# Patient Record
Sex: Female | Born: 1995 | Race: White | Hispanic: No | Marital: Single | Smoking: Never smoker
Health system: Southern US, Community
[De-identification: ages and names within clinical notes are randomized; demographics above are authoritative.]

## PROBLEM LIST (undated history)

## (undated) HISTORY — PX: ANTERIOR CRUCIATE LIGAMENT REPAIR: SHX115

---

## 2017-06-16 ENCOUNTER — Emergency Department (HOSPITAL_BASED_OUTPATIENT_CLINIC_OR_DEPARTMENT_OTHER)
Admission: EM | Admit: 2017-06-16 | Discharge: 2017-06-16 | Disposition: A | Discharge status: Home / Self Care | Payer: Managed Care, Other (non HMO) | Attending: Emergency Medicine | Admitting: Emergency Medicine

## 2017-06-16 ENCOUNTER — Emergency Department (HOSPITAL_BASED_OUTPATIENT_CLINIC_OR_DEPARTMENT_OTHER): Payer: Managed Care, Other (non HMO)

## 2017-06-16 ENCOUNTER — Encounter (HOSPITAL_BASED_OUTPATIENT_CLINIC_OR_DEPARTMENT_OTHER): Payer: Self-pay | Admitting: Emergency Medicine

## 2017-06-16 DIAGNOSIS — R112 Nausea with vomiting, unspecified: Secondary | ICD-10-CM | POA: Diagnosis not present

## 2017-06-16 DIAGNOSIS — R1030 Lower abdominal pain, unspecified: Secondary | ICD-10-CM | POA: Insufficient documentation

## 2017-06-16 LAB — CBC WITH DIFFERENTIAL/PLATELET
Basophils Absolute: 0 10*3/uL (ref 0.0–0.1)
Basophils Relative: 1 %
EOS ABS: 0.1 10*3/uL (ref 0.0–0.7)
EOS PCT: 1 %
HCT: 37.4 % (ref 36.0–46.0)
Hemoglobin: 13.1 g/dL (ref 12.0–15.0)
LYMPHS ABS: 1.7 10*3/uL (ref 0.7–4.0)
Lymphocytes Relative: 27 %
MCH: 30.7 pg (ref 26.0–34.0)
MCHC: 35 g/dL (ref 30.0–36.0)
MCV: 87.6 fL (ref 78.0–100.0)
MONO ABS: 0.6 10*3/uL (ref 0.1–1.0)
MONOS PCT: 9 %
Neutro Abs: 3.9 10*3/uL (ref 1.7–7.7)
Neutrophils Relative %: 62 %
PLATELETS: 264 10*3/uL (ref 150–400)
RBC: 4.27 MIL/uL (ref 3.87–5.11)
RDW: 12.2 % (ref 11.5–15.5)
WBC: 6.3 10*3/uL (ref 4.0–10.5)

## 2017-06-16 LAB — URINALYSIS, ROUTINE W REFLEX MICROSCOPIC
BILIRUBIN URINE: NEGATIVE
GLUCOSE, UA: NEGATIVE mg/dL
HGB URINE DIPSTICK: NEGATIVE
KETONES UR: NEGATIVE mg/dL
Leukocytes, UA: NEGATIVE
NITRITE: NEGATIVE
PH: 6.5 (ref 5.0–8.0)
Protein, ur: NEGATIVE mg/dL
Specific Gravity, Urine: 1.02 (ref 1.005–1.030)

## 2017-06-16 LAB — BASIC METABOLIC PANEL
Anion gap: 8 (ref 5–15)
BUN: 9 mg/dL (ref 6–20)
CHLORIDE: 106 mmol/L (ref 101–111)
CO2: 25 mmol/L (ref 22–32)
CREATININE: 0.82 mg/dL (ref 0.44–1.00)
Calcium: 9.2 mg/dL (ref 8.9–10.3)
GFR calc Af Amer: >60 mL/min (ref >60)
GLUCOSE: 100 mg/dL — AB (ref 65–99)
Potassium: 3.9 mmol/L (ref 3.5–5.1)
SODIUM: 139 mmol/L (ref 135–145)

## 2017-06-16 LAB — PREGNANCY, URINE: Preg Test, Ur: NEGATIVE

## 2017-06-16 MED ORDER — KETOROLAC TROMETHAMINE 30 MG/ML IJ SOLN
15.0000 mg | Freq: Once | INTRAMUSCULAR | Status: AC
  Administered 2017-06-16: 15 mg via INTRAVENOUS
  Filled 2017-06-16: qty 1

## 2017-06-16 MED ORDER — SODIUM CHLORIDE 0.9 % IV BOLUS (SEPSIS)
1000.0000 mL | Freq: Once | INTRAVENOUS | Status: AC
  Administered 2017-06-16: 1000 mL via INTRAVENOUS

## 2017-06-16 MED ORDER — ONDANSETRON HCL 4 MG/2ML IJ SOLN
4.0000 mg | Freq: Once | INTRAMUSCULAR | Status: AC
  Administered 2017-06-16: 4 mg via INTRAVENOUS
  Filled 2017-06-16: qty 2

## 2017-06-16 NOTE — ED Notes (Signed)
Spoke with pt's mother on the phone per her request. Micah Flesher over pt's lab results and discussed having CT scan done. Mother is satisfied with not having CT done at this time due to all lab work being normal and pt's appears to be feeling better. Pt has not had any vomiting since arrival to ED. Dr. Nicanor Alcon informed of conversation. Will PO challenge pt at this time.

## 2017-06-16 NOTE — ED Provider Notes (Signed)
[facility]   CSN: [id number] Arrival date & time: 06/16/17  0402     History   Chief Complaint Chief Complaint  Patient presents with  . Abdominal Pain    HPI Melanie Herrera is a 21 y.o. female.  The history is provided by the patient.  Abdominal Pain   This is a new problem. The current episode started 3 to 5 hours ago. The problem occurs constantly. The pain is associated with an unknown factor. The quality of the pain is cramping. The pain is moderate. Associated symptoms include nausea and vomiting. Pertinent negatives include fever, hematochezia, dysuria and myalgias. Nothing aggravates the symptoms. Nothing relieves the symptoms. Past workup does not include GI consult. Her past medical history does not include PUD, ulcerative colitis, Crohn's disease or irritable bowel syndrome.    History reviewed. No pertinent past medical history.  There are no active problems to display for this patient.   Past Surgical History:  Procedure Laterality Date  . ANTERIOR CRUCIATE LIGAMENT REPAIR      OB History    No data available       Home Medications    Prior to Admission medications   Not on File    Family History No family history on file.  Social History Social History  Substance Use Topics  . Smoking status: Never Smoker  . Smokeless tobacco: Never Used  . Alcohol use Yes     Allergies   Patient has no known allergies.   Review of Systems Review of Systems  Constitutional: Negative for appetite change and fever.  Gastrointestinal: Positive for abdominal pain, nausea and vomiting. Negative for hematochezia.  Genitourinary: Negative for dysuria.  Musculoskeletal: Negative for myalgias.  All other systems reviewed and are negative.    Physical Exam Updated Vital Signs BP 119/88 (BP Location: Right Arm)   Pulse 82   Temp 98.2 F (36.8 C) (Oral)   Resp 16   Ht  (1.6 m)   Wt 52.2 kg (115 lb)   LMP 06/02/2017  (Approximate)   SpO2 100%   BMI 20.37 kg/m   Physical Exam  Constitutional: She is oriented to person, place, and time. She appears well-developed and well-nourished. No distress.  HENT:  Head: Normocephalic and atraumatic.  Mouth/Throat: No oropharyngeal exudate.  Eyes: Pupils are equal, round, and reactive to light. Conjunctivae are normal.  Neck: Normal range of motion. Neck supple.  Cardiovascular: Normal rate, regular rhythm, normal heart sounds and intact distal pulses.   Pulmonary/Chest: Effort normal and breath sounds normal. She has no wheezes. She has no rales.  Abdominal: Soft. Bowel sounds are normal. She exhibits no mass. There is no tenderness. There is no rebound, no guarding, no tenderness at McBurney's point and negative Murphy's sign.  Abdomen is soft, no guarding or rebound, negative psoas, able to hop on one foot without pain.    Musculoskeletal: Normal range of motion.  Lymphadenopathy:    She has no cervical adenopathy.  Neurological: She is alert and oriented to person, place, and time.  Skin: Skin is warm and dry. Capillary refill takes less than 2 seconds.  Psychiatric: She has a normal mood and affect.     ED Treatments / Results  Labs (all labs ordered are listed, but only abnormal results are displayed)  Results for orders placed or performed during the hospital encounter of 06/16/17  Urinalysis, Routine w reflex microscopic  Result Value Ref Range   Color, Urine YELLOW YELLOW  APPearance CLEAR CLEAR   Specific Gravity, Urine 1.020 1.005 - 1.030   pH 6.5 5.0 - 8.0   Glucose, UA NEGATIVE NEGATIVE mg/dL   Hgb urine dipstick NEGATIVE NEGATIVE   Bilirubin Urine NEGATIVE NEGATIVE   Ketones, ur NEGATIVE NEGATIVE mg/dL   Protein, ur NEGATIVE NEGATIVE mg/dL   Nitrite NEGATIVE NEGATIVE   Leukocytes, UA NEGATIVE NEGATIVE  Pregnancy, urine  Result Value Ref Range   Preg Test, Ur NEGATIVE NEGATIVE  CBC with Differential/Platelet  Result Value Ref Range     WBC 6.3 4.0 - 10.5 K/uL   RBC 4.27 3.87 - 5.11 MIL/uL   Hemoglobin 13.1 12.0 - 15.0 g/dL   HCT 16.1 09.6 - 04.5 %   MCV 87.6 78.0 - 100.0 fL   MCH 30.7 26.0 - 34.0 pg   MCHC 35.0 30.0 - 36.0 g/dL   RDW 40.9 81.1 - 91.4 %   Platelets 264 150 - 400 K/uL   Neutrophils Relative % 62 %   Neutro Abs 3.9 1.7 - 7.7 K/uL   Lymphocytes Relative 27 %   Lymphs Abs 1.7 0.7 - 4.0 K/uL   Monocytes Relative 9 %   Monocytes Absolute 0.6 0.1 - 1.0 K/uL   Eosinophils Relative 1 %   Eosinophils Absolute 0.1 0.0 - 0.7 K/uL   Basophils Relative 1 %   Basophils Absolute 0.0 0.0 - 0.1 K/uL  Basic metabolic panel  Result Value Ref Range   Sodium 139 135 - 145 mmol/L   Potassium 3.9 3.5 - 5.1 mmol/L   Chloride 106 101 - 111 mmol/L   CO2 25 22 - 32 mmol/L   Glucose, Bld 100 (H) 65 - 99 mg/dL   BUN 9 6 - 20 mg/dL   Creatinine, Ser 7.82 0.44 - 1.00 mg/dL   Calcium 9.2 8.9 - 95.6 mg/dL   GFR calc non Af Amer >60 >60 mL/min   GFR calc Af Amer >60 >60 mL/min   Anion gap 8 5 - 15   No results found.  Radiology No results found.  Procedures Procedures (including critical care time)  Medications Ordered in ED Medications  sodium chloride 0.9 % bolus 1,000 mL (1,000 mLs Intravenous New Bag/Given 06/16/17 0431)  ondansetron (ZOFRAN) injection 4 mg (4 mg Intravenous Given 06/16/17 0431)  ketorolac (TORADOL) 30 MG/ML injection 15 mg (15 mg Intravenous Given 06/16/17 0456)    Patient discussed her care with her mother and they have elected to wait on CT scan.  She feels much better  Final Clinical Impressions(s) / ED Diagnoses   Without fever, anorexia, RLQ tenderness on exam it is reasonable to wait on CT scan.  She is given strict return precautions for constant pain, persistent nausea or vomiting, fever, anorexia if the abdomen gets rock hard, she's unable to tolerate food or any concerns.  Patient verbalizes understanding and agrees to follow up.    Shortness of breath, swelling or the lips or  tongue, chest pain, dyspnea on exertion, new weakness or numbness changes in vision or speech,  Inability to tolerate liquids or food, changes in voice cough, altered mental status or any concerns. No signs of systemic illness or infection. The patient is nontoxic-appearing on exam and vital signs are within normal limits.    I have reviewed the triage vital signs and the nursing notes. Pertinent labs &imaging results that were available during my care of the patient were reviewed by me and considered in my medical decision making (see chart for details).  After  history, exam, and medical workup I feel the patient has been appropriately medically screened and is safe for discharge home. Pertinent diagnoses were discussed with the patient. Patient was given return precautions.        Volland, Mckynzie Liwanag, MD 06/16/17 [phone]

## 2017-06-16 NOTE — ED Triage Notes (Signed)
Pt reports right sided abd pain that started at 1 am with vomiting x 5 episodes since onset. Pt denies any diarrhea.

## 2020-06-17 ENCOUNTER — Emergency Department
Admission: EM | Admit: 2020-06-17 | Discharge: 2020-06-17 | Disposition: A | Discharge status: Home / Self Care | Payer: 59 | Attending: Emergency Medicine | Admitting: Emergency Medicine

## 2020-06-17 ENCOUNTER — Emergency Department: Payer: 59

## 2020-06-17 ENCOUNTER — Other Ambulatory Visit
Admission: RE | Admit: 2020-06-17 | Discharge: 2020-06-17 | Disposition: A | Discharge status: Home / Self Care | Payer: 59 | Source: Ambulatory Visit | Attending: Family Medicine | Admitting: Family Medicine

## 2020-06-17 ENCOUNTER — Other Ambulatory Visit: Payer: Self-pay

## 2020-06-17 ENCOUNTER — Encounter: Payer: Self-pay | Admitting: Emergency Medicine

## 2020-06-17 DIAGNOSIS — R0781 Pleurodynia: Secondary | ICD-10-CM | POA: Diagnosis present

## 2020-06-17 DIAGNOSIS — R000 Tachycardia, unspecified: Secondary | ICD-10-CM | POA: Diagnosis not present

## 2020-06-17 DIAGNOSIS — J189 Pneumonia, unspecified organism: Secondary | ICD-10-CM

## 2020-06-17 DIAGNOSIS — J181 Lobar pneumonia, unspecified organism: Secondary | ICD-10-CM | POA: Insufficient documentation

## 2020-06-17 DIAGNOSIS — Z20822 Contact with and (suspected) exposure to covid-19: Secondary | ICD-10-CM | POA: Insufficient documentation

## 2020-06-17 DIAGNOSIS — M546 Pain in thoracic spine: Secondary | ICD-10-CM | POA: Diagnosis not present

## 2020-06-17 DIAGNOSIS — R0600 Dyspnea, unspecified: Secondary | ICD-10-CM | POA: Insufficient documentation

## 2020-06-17 LAB — COMPREHENSIVE METABOLIC PANEL
ALT: 20 U/L (ref 0–44)
AST: 23 U/L (ref 15–41)
Albumin: 4.1 g/dL (ref 3.5–5.0)
Alkaline Phosphatase: 56 U/L (ref 38–126)
Anion gap: 9 (ref 5–15)
BUN: 11 mg/dL (ref 6–20)
CO2: 25 mmol/L (ref 22–32)
Calcium: 9.2 mg/dL (ref 8.9–10.3)
Chloride: 101 mmol/L (ref 98–111)
Creatinine, Ser: 0.7 mg/dL (ref 0.44–1.00)
GFR calc Af Amer: >60 mL/min (ref >60)
GFR calc non Af Amer: >60 mL/min (ref >60)
Glucose, Bld: 89 mg/dL (ref 70–99)
Potassium: 4.2 mmol/L (ref 3.5–5.1)
Sodium: 135 mmol/L (ref 135–145)
Total Bilirubin: 0.9 mg/dL (ref 0.3–1.2)
Total Protein: 8.1 g/dL (ref 6.5–8.1)

## 2020-06-17 LAB — CBC WITH DIFFERENTIAL/PLATELET
Abs Immature Granulocytes: 0.03 10*3/uL (ref 0.00–0.07)
Basophils Absolute: 0 10*3/uL (ref 0.0–0.1)
Basophils Relative: 0 %
Eosinophils Absolute: 0 10*3/uL (ref 0.0–0.5)
Eosinophils Relative: 0 %
HCT: 35.6 % — ABNORMAL LOW (ref 36.0–46.0)
Hemoglobin: 12.3 g/dL (ref 12.0–15.0)
Immature Granulocytes: 0 %
Lymphocytes Relative: 12 %
Lymphs Abs: 1 10*3/uL (ref 0.7–4.0)
MCH: 30.9 pg (ref 26.0–34.0)
MCHC: 34.6 g/dL (ref 30.0–36.0)
MCV: 89.4 fL (ref 80.0–100.0)
Monocytes Absolute: 0.5 10*3/uL (ref 0.1–1.0)
Monocytes Relative: 6 %
Neutro Abs: 7.3 10*3/uL (ref 1.7–7.7)
Neutrophils Relative %: 82 %
Platelets: 308 10*3/uL (ref 150–400)
RBC: 3.98 MIL/uL (ref 3.87–5.11)
RDW: 12.3 % (ref 11.5–15.5)
WBC: 8.9 10*3/uL (ref 4.0–10.5)
nRBC: 0 % (ref 0.0–0.2)

## 2020-06-17 LAB — RESPIRATORY PANEL BY RT PCR (FLU A&B, COVID)
Influenza A by PCR: NEGATIVE
Influenza B by PCR: NEGATIVE
SARS Coronavirus 2 by RT PCR: NEGATIVE

## 2020-06-17 LAB — FIBRIN DERIVATIVES D-DIMER (ARMC ONLY)
Fibrin derivatives D-dimer (ARMC): 504.38 ng/mL (FEU) — ABNORMAL HIGH (ref 0.00–499.00)
Fibrin derivatives D-dimer (ARMC): 520.34 ng/mL (FEU) — ABNORMAL HIGH (ref 0.00–499.00)

## 2020-06-17 MED ORDER — IOHEXOL 350 MG/ML SOLN
75.0000 mL | Freq: Once | INTRAVENOUS | Status: AC | PRN
  Administered 2020-06-17: 16:00:00 75 mL via INTRAVENOUS
  Filled 2020-06-17: qty 75

## 2020-06-17 MED ORDER — CEFDINIR 300 MG PO CAPS: 300.0000 mg | ORAL_CAPSULE | Freq: Two times a day (BID) | ORAL | 0 refills | Status: AC

## 2020-06-17 NOTE — Discharge Instructions (Addendum)
You were seen in the ED because of your chest pain.  We did a CT scan of your chest that shows evidence of a pneumonia sitting in the very backside of her lungs, which is likely why you have pain in your back with deep inspirations.  You are being discharged a prescription for Omnicef/cefdinir antibiotic.  Please take 1 pill, 2 times per day, for the next 10 days.  Please finish all 20 pills, even if your symptoms are getting better.  Please take Tylenol and ibuprofen/Advil for your pain.  It is safe to take them together, or to alternate them every few hours.  Take up to 1000mg  of Tylenol at a time, up to 4 times per day.  Do not take more than 4000 mg of Tylenol in 24 hours.  For ibuprofen, take 400-600 mg, 4-5 times per day.  I would also recommend that you follow-up with your primary care physician within the next 1 week to ensure that your symptoms are improving.  If you develop any worsening symptoms despite these medications, please return to the ED.

## 2020-06-17 NOTE — ED Provider Notes (Signed)
[facility] Emergency Department Provider Note ____________________________________________   First MD Initiated Contact with Patient 06/17/20 1340     (approximate)  I have reviewed the triage vital signs and the nursing notes.  HISTORY  Chief Complaint No chief complaint on file.   HPI Melanie Herrera is a 24 y.o. femalewho presents to the ED for evaluation of chest pain.   Chart review indicates patient was seen earlier this morning at [facility] clinic walk-in clinic, noted to have pleuritic chest pain, tachycardia and a positive outpatient D-dimer.  Urine pregnancy test negative this morning.  Referred to the ED for CTA chest to evaluate for PE.  Patient reports about 3-4 days of pleuritic right-sided posterior chest pain.  She reports occasionally coughing, and this is typically nonproductive.  Her primary complaint is pain.  She denies any pain at rest, but reports pleuritic pain, up to 7/10 intensity and located to her right posterior back thoracically.  This pain is nonradiating and she has not taken any medications to help alleviate this pain.  She denies any frontal chest pain, vomiting, syncope, abdominal pain, stool changes, dysuria, vaginal bleeding/discharge.  Denies syncope, trauma and fever.  She reports being fully vaccinated with childhood vaccines, COVID-19 vaccine and influenza.  Lives in a dorm and is a resident at [facility], has family and PCP in [place].  History reviewed. No pertinent past medical history.  There are no problems to display for this patient.   Past Surgical History:  Procedure Laterality Date  . ANTERIOR CRUCIATE LIGAMENT REPAIR      Prior to Admission medications   Medication Sig Start Date End Date Taking? Authorizing Provider  cefdinir (OMNICEF) 300 MG capsule Take 1 capsule (300 mg total) by mouth 2 (two) times daily for 10 days. 06/17/20 06/27/20  Delton Prairie, MD    Allergies Patient has  no known allergies.  No family history on file.  Social History Social History   Tobacco Use  . Smoking status: Never Smoker  . Smokeless tobacco: Never Used  Substance Use Topics  . Alcohol use: Yes  . Drug use: No    Review of Systems  Constitutional: No fever/chills Eyes: No visual changes. ENT: No sore throat. Cardiovascular: Positive for chest pain. Respiratory: Denies shortness of breath.  Positive for cough Gastrointestinal: No abdominal pain.  No nausea, no vomiting.  No diarrhea.  No constipation. Genitourinary: Negative for dysuria. Musculoskeletal: Negative for back pain. Skin: Negative for rash. Neurological: Negative for headaches, focal weakness or numbness.  ____________________________________________   PHYSICAL EXAM:  VITAL SIGNS: Vitals:   06/17/20 1053  BP: 128/79  Pulse: (!) 114  Resp: 20  Temp: 99.3 F (37.4 C)  SpO2: 100%      Constitutional: Alert and oriented. Well appearing and in no acute distress.  Pleasant and conversational full sentences. Eyes: Conjunctivae are normal. PERRL. EOMI. Head: Atraumatic. Nose: No congestion/rhinnorhea. Mouth/Throat: Mucous membranes are moist.  Oropharynx non-erythematous. Neck: No stridor. No cervical spine tenderness to palpation. Cardiovascular: Tachycardic rate, regular rhythm. Grossly normal heart sounds.  Good peripheral circulation.  Respiratory: Normal respiratory effort.  No retractions.  Faint right basilar crackles, lungs otherwise CTAB. Gastrointestinal: Soft , nondistended, nontender to palpation. No abdominal bruits. No CVA tenderness. Musculoskeletal: No lower extremity tenderness nor edema.  No joint effusions. No signs of acute trauma. Neurologic:  Normal speech and language. No gross focal neurologic deficits are appreciated. No gait instability noted. Skin:  Skin is warm, dry  and intact. No rash noted. Psychiatric: Mood and affect are normal. Speech and behavior are  normal.  ____________________________________________   LABS (all labs ordered are listed, but only abnormal results are displayed)  Labs Reviewed  CBC WITH DIFFERENTIAL/PLATELET - Abnormal; Notable for the following components:      Result Value   HCT 35.6 (*)    All other components within normal limits  FIBRIN DERIVATIVES D-DIMER ([facility] ONLY) - Abnormal; Notable for the following components:   Fibrin derivatives D-dimer ([facility]) 520.34 (*)    All other components within normal limits  RESPIRATORY PANEL BY RT PCR (FLU A&B, COVID)  COMPREHENSIVE METABOLIC PANEL  POC URINE PREG, ED   ____________________________________________  RADIOLOGY  ED MD interpretation: CT chest reviewed with right lower lobe rounded airspace opacity  Official radiology report(s): CT Angio Chest PE W and/or Wo Contrast  Result Date: 06/17/2020 CLINICAL DATA:  Shortness of breath and elevated D-dimer. EXAM: CT ANGIOGRAPHY CHEST WITH CONTRAST TECHNIQUE: Multidetector CT imaging of the chest was performed using the standard protocol during bolus administration of intravenous contrast. Multiplanar CT image reconstructions and MIPs were obtained to evaluate the vascular anatomy. CONTRAST:  36mL OMNIPAQUE IOHEXOL 350 MG/ML SOLN COMPARISON:  None. FINDINGS: Cardiovascular: The pulmonary arteries are adequately opacified. There is no evidence of pulmonary embolism. The thoracic aorta is of normal caliber and normally patent. The heart size is normal. No pericardial fluid identified. Mediastinum/Nodes: Mildly prominent right hilar and subcarinal lymph nodes are likely reactive. Lungs/Pleura: Rounded area of airspace consolidation in the superior segment of the right lower lobe in the posterior subpleural lung measures roughly 4.0 x 2.7 x 3.8 cm. This is most likely consistent with a round pneumonia in this age population. No associated pleural fluid, pneumothorax or pulmonary edema. Upper Abdomen: No acute abnormality.  Musculoskeletal: No chest wall abnormality. No acute or significant osseous findings. Review of the MIP images confirms the above findings. IMPRESSION: 1. No evidence of pulmonary embolism. 2. Rounded area of airspace consolidation in the superior segment of the right lower lobe in the posterior subpleural lung measures roughly 4.0 x 2.7 x 3.8 cm and is most likely consistent with a round pneumonia in this age population. Mildly prominent right hilar and subcarinal lymph nodes are likely reactive. Follow-up with chest x-ray is recommended to ensure resolution with treatment. Electronically Signed   By: Irish Lack M.D.   On: 06/17/2020 16:15    ____________________________________________   PROCEDURES and INTERVENTIONS  Procedure(s) performed (including Critical Care):  Procedures  Medications  iohexol (OMNIPAQUE) 350 MG/ML injection 75 mL (75 mLs Intravenous Contrast Given 06/17/20 1558)    ____________________________________________   MDM / ED COURSE  Otherwise healthy 24 year old female presents to the ED for evaluation of right-sided posterior pleuritic chest pain, most consistent with peripheral community-acquired pneumonia, and amenable to outpatient management.  Patient tachycardic upon arrival, otherwise normal vital signs on room air.  Exam is reassuring, demonstrating a well-appearing patient who is no distress, tachypnea/dyspnea, or symptoms to preclude outpatient management.  She does have faint right basilar crackles, otherwise a benign pulmonary exam.  Basic labs unremarkable.  Negative urine pregnancy at clinic this morning and positive D-dimer at clinic this morning.  CTA chest obtained to evaluate for acute PE due to her symptoms, D-dimer and sinus tachycardia.  This does not show evidence of acute PE, though does show evidence of a peripheral round CAP near her pleura and likely the source of her pain.  While her symptoms are not  completely typical, her work-up is rather  clear.  She is suitable for outpatient management and following up with her PCP.  We discussed return precautions for the ED and outpatient management.  Patient medically stable for discharge home.     ____________________________________________   FINAL CLINICAL IMPRESSION(S) / ED DIAGNOSES  Final diagnoses:  Community acquired pneumonia of right lower lobe of lung  Sinus tachycardia  Pleurodynia     ED Discharge Orders         Ordered    cefdinir (OMNICEF) 300 MG capsule  2 times daily        06/17/20 1656           Dmarius Reeder   Note:  This document was prepared using Conservation officer, historic buildings and may include unintentional dictation errors.   Delton Prairie, MD 06/17/20 [phone]

## 2020-06-17 NOTE — ED Triage Notes (Signed)
Pt over from [facility] with elevated D-dimer, tachycardia and pain in her right lower lung lobe.

## 2020-06-17 NOTE — ED Notes (Signed)
See triage note  States she has had some SOB intermittently for about 1 week  Low grade temp on arrival   States she was sent over from [facility] for abnormal lab work

## 2021-07-26 IMAGING — CT CT ANGIO CHEST
2 of 6 series · 19 of 46 positions shown · IV contrast (APPLIED)
Comparison: None.

CLINICAL DATA: Shortness of breath and elevated D-dimer.

EXAM:
CT ANGIOGRAPHY CHEST WITH CONTRAST
TECHNIQUE: Multidetector CT imaging of the chest was performed using the
standard protocol during bolus administration of intravenous
contrast. Multiplanar CT image reconstructions and MIPs were
obtained to evaluate the vascular anatomy.
CONTRAST:  75mL OMNIPAQUE IOHEXOL 350 MG/ML SOLN

[Series 5: thins · axial · 0.66mm/px · z∈[-159,+101]mm · 16 of 286 slices shown]
[im 13/286  lung]
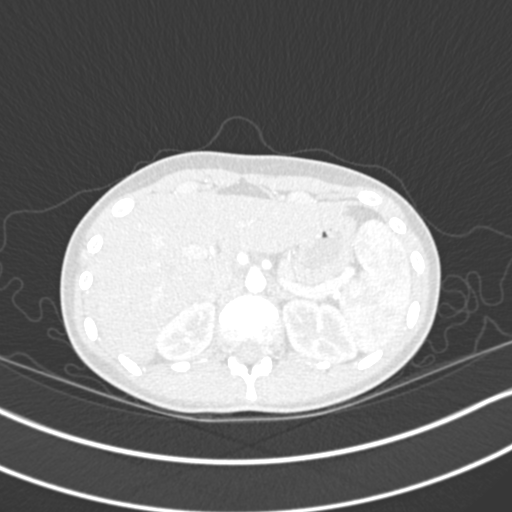
[im 38/286  soft-tissue]
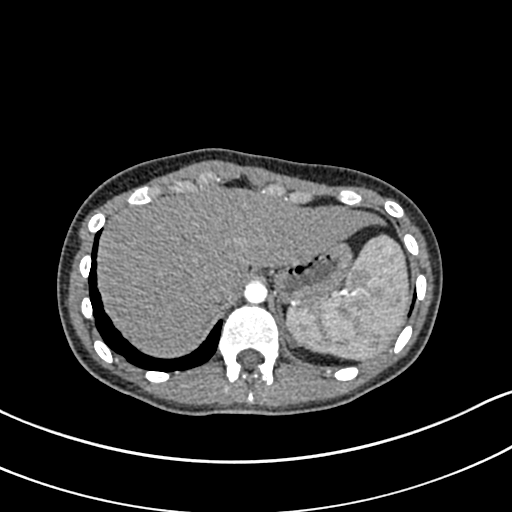
[im 50/286  lung]
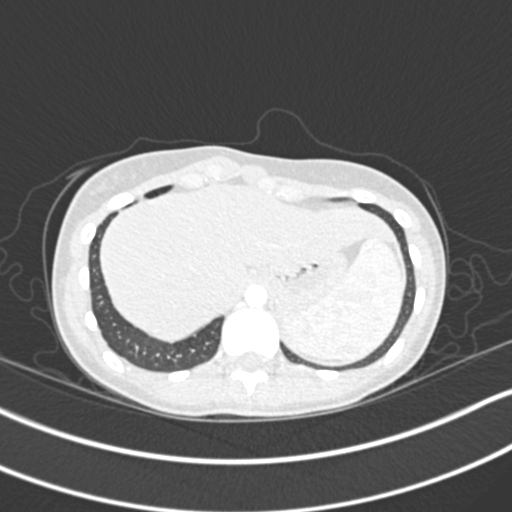
[im 62/286  soft-tissue]
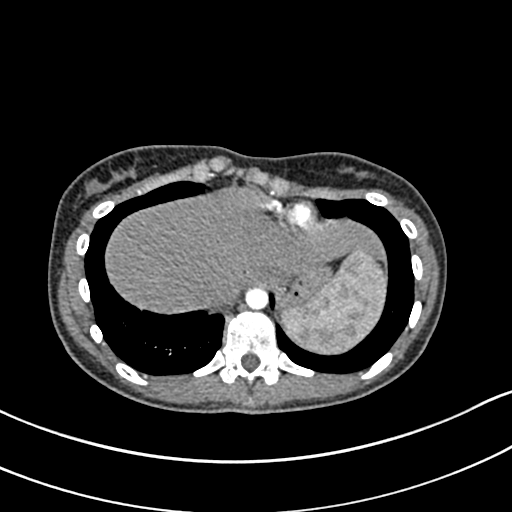
[im 87/286  lung]
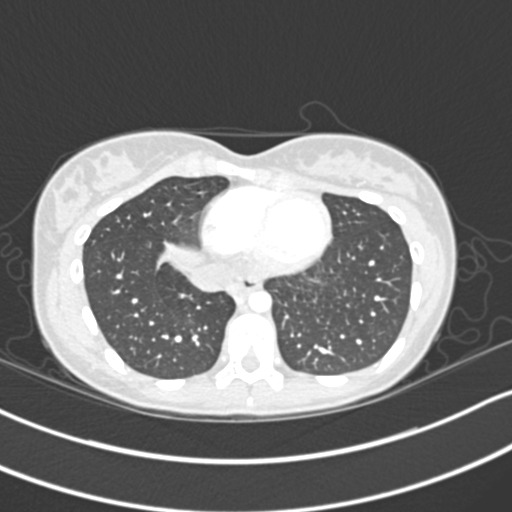
[im 100/286  soft-tissue]
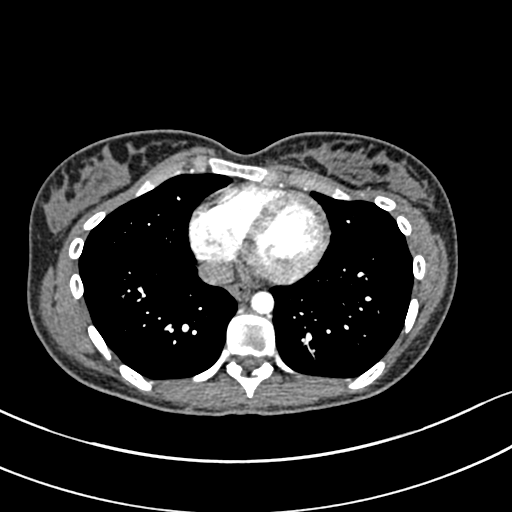
[im 112/286  lung]
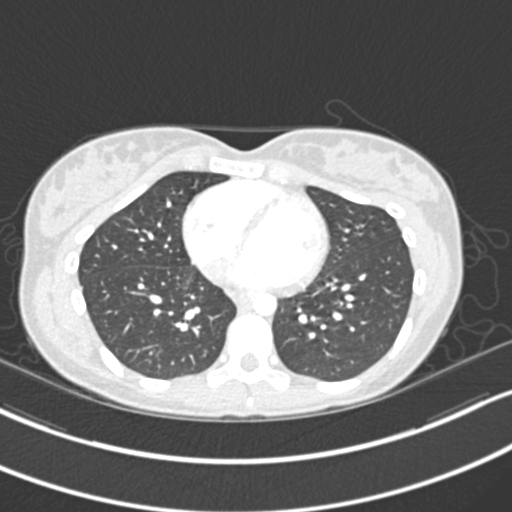
[im 137/286  soft-tissue]
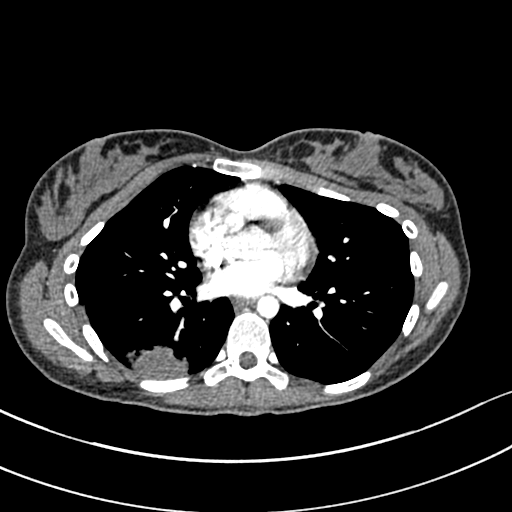
[im 149/286  lung]
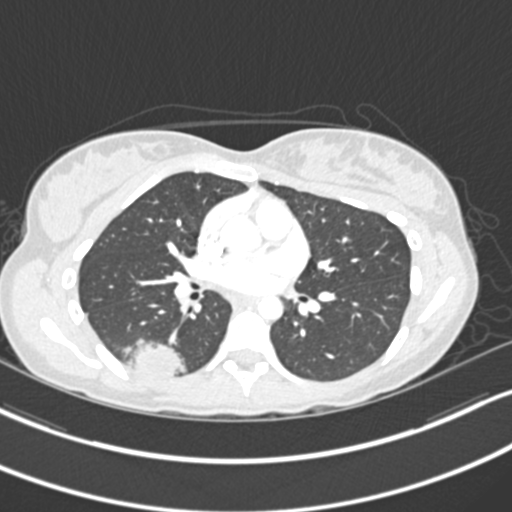
[im 174/286  soft-tissue]
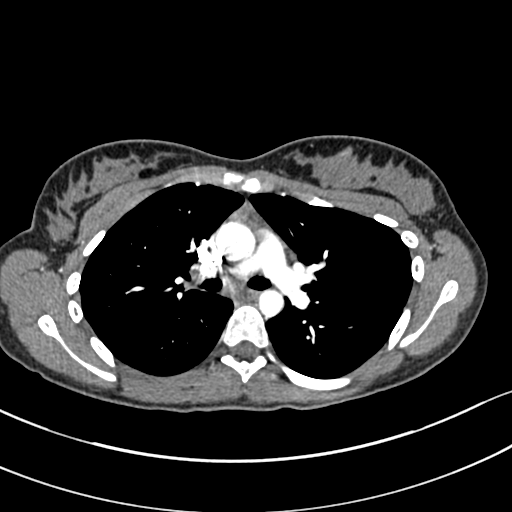
[im 186/286  lung]
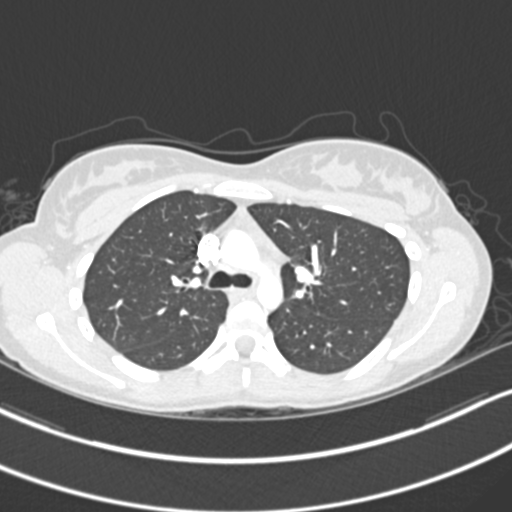
[im 199/286  soft-tissue]
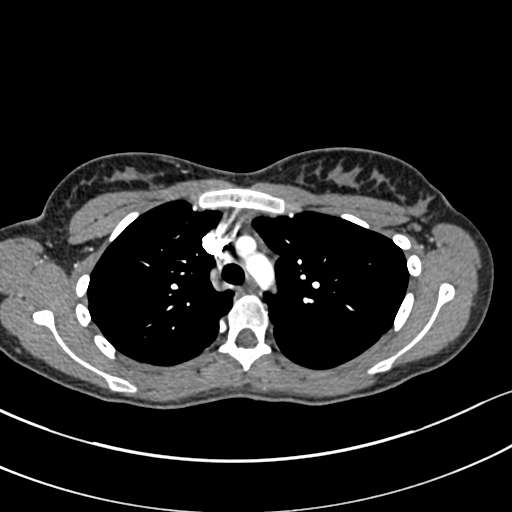
[im 224/286  lung]
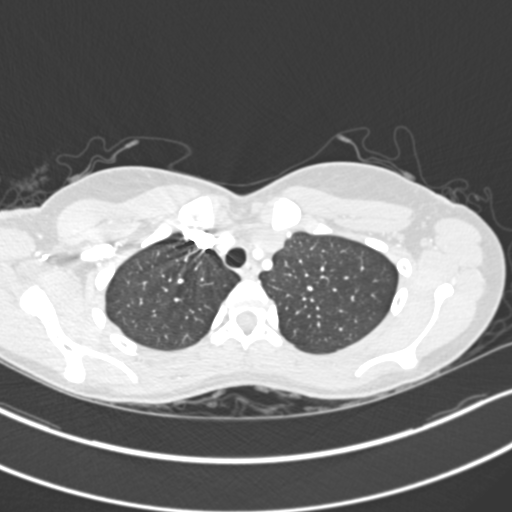
[im 236/286  soft-tissue]
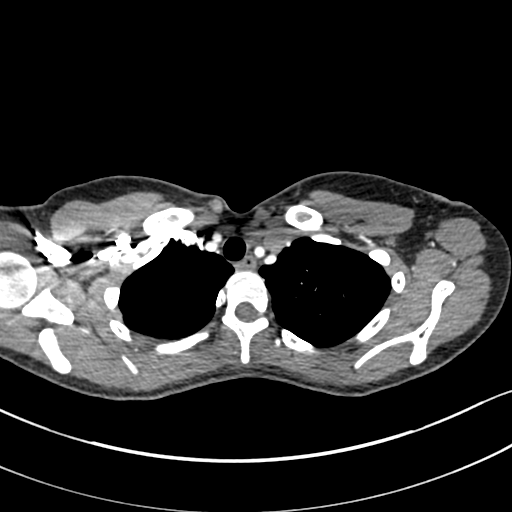
[im 248/286  lung]
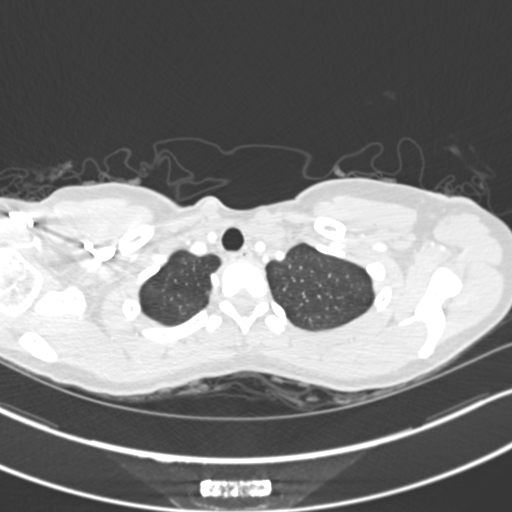
[im 273/286  soft-tissue]
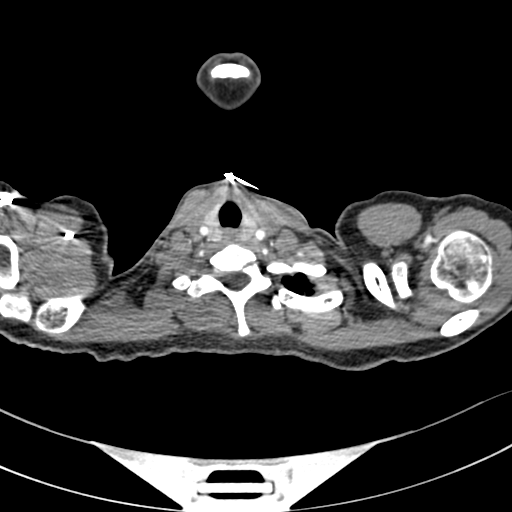

[Series 7: coronal mpr · coronal · 0.56mm/px · 3 of 62 slices shown]
[im 16/62  soft-tissue]
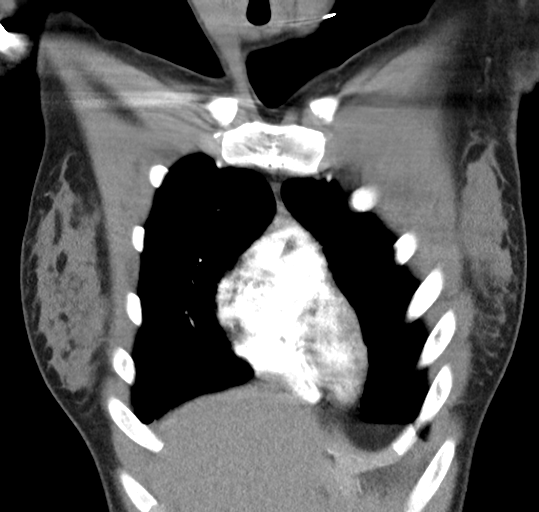
[im 31/62  soft-tissue]
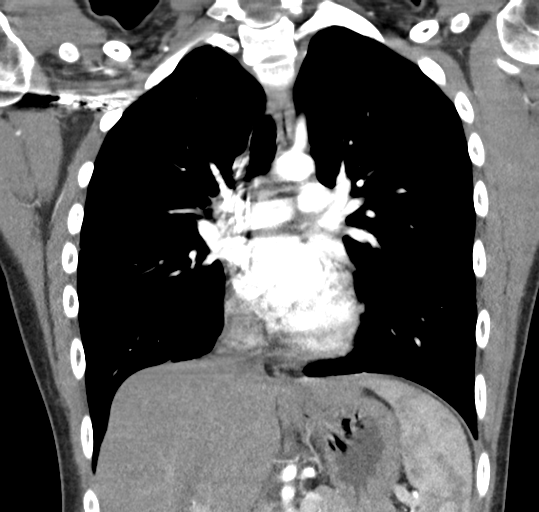
[im 46/62  soft-tissue]
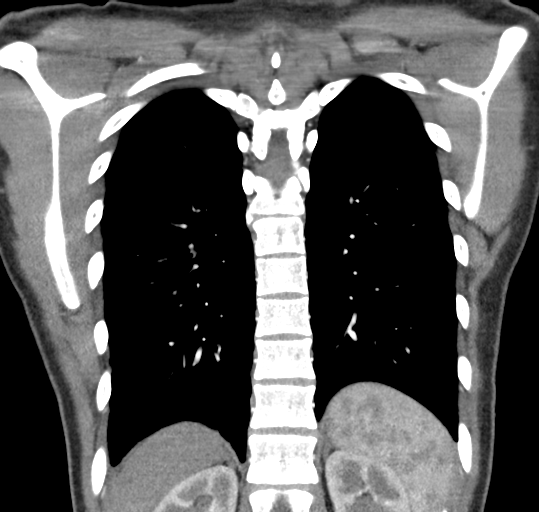

[19 of 46 positions shown; findings below may reference images not displayed]

FINDINGS: Cardiovascular: The pulmonary arteries are adequately opacified.
There is no evidence of pulmonary embolism. The thoracic aorta is of
normal caliber and normally patent. The heart size is normal. No
pericardial fluid identified.

Mediastinum/Nodes: Mildly prominent right hilar and subcarinal lymph
nodes are likely reactive.

Lungs/Pleura: Rounded area of airspace consolidation in the superior
segment of the right lower lobe in the posterior subpleural lung
measures roughly 4.0 x 2.7 x 3.8 cm. This is most likely consistent
with a round pneumonia in this age population. No associated pleural
fluid, pneumothorax or pulmonary edema.

Upper Abdomen: No acute abnormality.

Musculoskeletal: No chest wall abnormality. No acute or significant
osseous findings.

Review of the MIP images confirms the above findings.
IMPRESSION: 1. No evidence of pulmonary embolism.
2. Rounded area of airspace consolidation in the superior segment of
the right lower lobe in the posterior subpleural lung measures
roughly 4.0 x 2.7 x 3.8 cm and is most likely consistent with a
round pneumonia in this age population. Mildly prominent right hilar
and subcarinal lymph nodes are likely reactive. Follow-up with chest
x-ray is recommended to ensure resolution with treatment.
